# Patient Record
Sex: Female | Born: 1995 | Race: White | Hispanic: No | Marital: Single | Smoking: Never smoker
Health system: Southern US, Community
[De-identification: ages and names within clinical notes are randomized; demographics above are authoritative.]

## PROBLEM LIST (undated history)

## (undated) DIAGNOSIS — J45909 Unspecified asthma, uncomplicated: Secondary | ICD-10-CM

---

## 2015-02-18 DIAGNOSIS — J029 Acute pharyngitis, unspecified: Secondary | ICD-10-CM | POA: Diagnosis not present

## 2015-03-16 DIAGNOSIS — J309 Allergic rhinitis, unspecified: Secondary | ICD-10-CM | POA: Diagnosis not present

## 2015-03-16 DIAGNOSIS — R0600 Dyspnea, unspecified: Secondary | ICD-10-CM | POA: Diagnosis not present

## 2015-06-15 DIAGNOSIS — J209 Acute bronchitis, unspecified: Secondary | ICD-10-CM

## 2015-06-16 ENCOUNTER — Ambulatory Visit
Admission: RE | Admit: 2015-06-16 | Discharge: 2015-06-16 | Disposition: A | Discharge status: Home / Self Care | Payer: BLUE CROSS/BLUE SHIELD | Source: Ambulatory Visit | Attending: Family Medicine | Admitting: Family Medicine

## 2015-06-16 ENCOUNTER — Other Ambulatory Visit: Payer: Self-pay | Admitting: Family Medicine

## 2015-06-16 DIAGNOSIS — R61 Generalized hyperhidrosis: Secondary | ICD-10-CM

## 2015-06-16 DIAGNOSIS — R058 Other specified cough: Secondary | ICD-10-CM

## 2015-06-16 DIAGNOSIS — R05 Cough: Secondary | ICD-10-CM

## 2015-10-04 ENCOUNTER — Other Ambulatory Visit: Payer: Self-pay | Admitting: Family Medicine

## 2015-10-04 ENCOUNTER — Encounter: Payer: Self-pay | Admitting: Family Medicine

## 2015-10-04 ENCOUNTER — Ambulatory Visit
Admission: RE | Admit: 2015-10-04 | Discharge: 2015-10-04 | Disposition: A | Discharge status: Home / Self Care | Payer: BLUE CROSS/BLUE SHIELD | Source: Ambulatory Visit | Attending: Family Medicine | Admitting: Family Medicine

## 2015-10-04 ENCOUNTER — Ambulatory Visit (INDEPENDENT_AMBULATORY_CARE_PROVIDER_SITE_OTHER): Payer: BLUE CROSS/BLUE SHIELD | Admitting: Family Medicine

## 2015-10-04 DIAGNOSIS — M545 Low back pain, unspecified: Secondary | ICD-10-CM

## 2015-10-04 MED ORDER — CYCLOBENZAPRINE HCL 5 MG PO TABS: 5.0000 mg | ORAL_TABLET | Freq: Every day | ORAL | Status: DC

## 2015-10-04 MED ORDER — NAPROXEN 500 MG PO TABS: 500.0000 mg | ORAL_TABLET | Freq: Two times a day (BID) | ORAL | Status: DC

## 2015-10-04 NOTE — Progress Notes (Signed)
Patient ID: Leslie Gilmore, female   DOB: 1995/12/29, 20 y.o.   MRN: [id number]  Patient presents today with symptoms of lower back pain. Patient states that she's had the symptoms for the last 4-5 weeks. She denies any particular injury. She denies any chronic history of back pain. She denies any radicular symptoms, incontinence, foot drop, lower extremity weakness. She has noticed pain in a general area of her lower back with extension and flexion. She denies any problems when she is sleeping. She now states that she can feel the pain sometimes with walking. She admits that she was told that she had mild scoliosis before.  ROS: Negative except mentioned above.  Vitals as per Epic. GENERAL: NAD RESP: CTA B CARD: RRR  MSK: No midline tenderness to the back, paravertebral tenderness generalized on the right and left in the lower lumbar area, pain with flexion and extension however more pronounced with extension, negative straight leg raise, 5 out of 5 strength of lower extremities, normal heel and toe walk, normal gait, NV intact  A/P: Lower back pain-will do x-rays initially to evaluate for spondy., Naprosyn and Flexeril prescribed, rest, I imagine she will be limited for the last 2 practices of the season. If the x-rays are negative she can participate based on her pain level and management per training staff.

## 2015-10-14 ENCOUNTER — Encounter: Payer: Self-pay | Admitting: Family Medicine

## 2015-10-14 ENCOUNTER — Ambulatory Visit (INDEPENDENT_AMBULATORY_CARE_PROVIDER_SITE_OTHER): Payer: BLUE CROSS/BLUE SHIELD | Admitting: Family Medicine

## 2015-10-14 DIAGNOSIS — M545 Low back pain, unspecified: Secondary | ICD-10-CM

## 2015-10-14 MED ORDER — CYCLOBENZAPRINE HCL 5 MG PO TABS: 5.0000 mg | ORAL_TABLET | Freq: Two times a day (BID) | ORAL | Status: DC | PRN

## 2015-10-14 NOTE — Progress Notes (Signed)
Patient ID: Jenita SeashoreElizabeth Troutman, female   DOB: 04-07-1996, 20 y.o.   MRN: [id number]   Patient presents today for follow-up regarding her lower back pain. Patient states that she has not had much improvement with the medications that were prescribed for her and rest. She denies any incontinence, foot drop, radicular symptoms. Her pain is now more localized to the upper lumbar area. She still is having pain with extension but now feels more discomfort with flexion. Sitting and lying down are also causing discomfort. Has some pain with walking at times. Her x-rays were read as normal.  ROS: Negative except mentioned above.  Vitals as per Epic.  GENERAL: NAD RESP: CTA B CARD: RRR MSK: no midline tenderness, mild to moderate discomfort with spasm in the upper lumbar region R>L, decreased flexion due to discomfort, now has slightly more discomfort with flexion then extension, discomfort in back with testing straight leg raise but no radicular symptoms, tight hamstrings, normal gait.  NEURO: CN II-XII grossly intact   A/P: Lumbar back pain with no radicular symptoms- patient has not had significant improvement with the medications prescribed and rest, x-rays were read as normal, will proceed with doing an MRI to look for any discogenic or spondy., Will refill her Flexeril which she does state is helping some. She believes that she still has some Naproxen and will switch over to Aleve after this has run out if needed. I've asked her to seek immediate medical attention if symptoms do worsen as discussed. She will likely need need to follow-up with a back specialist, PT etc. at home as she is leaving for summer break next week.

## 2015-10-20 ENCOUNTER — Ambulatory Visit
Admission: RE | Admit: 2015-10-20 | Discharge: 2015-10-20 | Disposition: A | Discharge status: Home / Self Care | Payer: BLUE CROSS/BLUE SHIELD | Source: Ambulatory Visit | Attending: Family Medicine | Admitting: Family Medicine

## 2015-10-20 DIAGNOSIS — M4806 Spinal stenosis, lumbar region: Secondary | ICD-10-CM | POA: Insufficient documentation

## 2015-10-20 DIAGNOSIS — M5126 Other intervertebral disc displacement, lumbar region: Secondary | ICD-10-CM | POA: Diagnosis not present

## 2015-10-20 DIAGNOSIS — M545 Low back pain, unspecified: Secondary | ICD-10-CM

## 2015-10-21 ENCOUNTER — Encounter: Payer: Self-pay | Admitting: Family Medicine

## 2015-10-21 ENCOUNTER — Ambulatory Visit (INDEPENDENT_AMBULATORY_CARE_PROVIDER_SITE_OTHER): Payer: BLUE CROSS/BLUE SHIELD | Admitting: Family Medicine

## 2015-10-21 DIAGNOSIS — M545 Low back pain: Secondary | ICD-10-CM

## 2015-10-21 NOTE — Progress Notes (Signed)
Patient ID: Leslie Gilmore, female   DOB: July 16, 1995, 20 y.o.   MRN: [id number] Patient presents today to discuss results of her lumbar spine MRI. Patient states that her pain symptoms have improved since her last visit with me. She denies any radicular symptoms, incontinence, foot drop, weakness of lower extremities. She did call her father on her cell phone so he could hear the results of the MRI - a disc bulge at L4-5 contributing to borderline bilateral subarticular lateral recess stenosis. However, no overt impingement was identified. The patient is going home this weekend to [place]. She will be there for the rest of the summer and will be [place] [place] [place]. I have advised her to see an orthopedic back specialist when she is home. Her father states that she will likely see an orthopedic physician at [facility]. I have encouraged Leslie Gilmore to take her imaging and reports [facility] with her when she goes home. I have advised her to avoid any activities for now that cause her pain such as squatting or lifting. I will discuss the plan moving forward with Leslie Gilmore, the trainer for volleyball. Patient did not have any significant improvement with Naprosyn so will try oral tapered steroid pack for 12 days. If patient has any ill side effects from the Prednisone she is to stop the medication and let the trainer know so I can be informed. Will discuss further course of care after patient has seen orthopedic back specialist at home. Patient's and father's questions were answered. Appreciative of care.

## 2016-01-24 ENCOUNTER — Ambulatory Visit (INDEPENDENT_AMBULATORY_CARE_PROVIDER_SITE_OTHER): Payer: BLUE CROSS/BLUE SHIELD | Admitting: Family Medicine

## 2016-01-24 DIAGNOSIS — M545 Low back pain, unspecified: Secondary | ICD-10-CM

## 2016-01-24 MED ORDER — DICLOFENAC SODIUM 75 MG PO TBEC: 75.0000 mg | DELAYED_RELEASE_TABLET | Freq: Two times a day (BID) | ORAL | 0 refills | Status: AC

## 2016-01-24 MED ORDER — CYCLOBENZAPRINE HCL 5 MG PO TABS: 5.0000 mg | ORAL_TABLET | Freq: Every day | ORAL | 1 refills | Status: AC

## 2016-01-25 ENCOUNTER — Other Ambulatory Visit: Payer: Self-pay | Admitting: Family Medicine

## 2016-01-25 DIAGNOSIS — IMO0002 Reserved for concepts with insufficient information to code with codable children: Secondary | ICD-10-CM

## 2016-01-25 NOTE — Progress Notes (Signed)
Patient presents today for follow-up regarding her lower back pain. Patient met with me a week ago with her father to discuss her current situation with her back. Over the summer the patient was seen by a physician affiliated with the [facility]. Her imaging that was done here was reviewed with the physician and the physician suggested that she had a strain of her lumbar back and should work with PT. She had dry needling and cupping as some of the alternative treatments as well. 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 She states that she did not have any significant pain with playing volleyball over the summer.  I advised the patient to come and see me on a weekly basis as she starts to advance her activity with volleyball this season. She is here today to discuss how her first week has been with volleyball. She admits that initially she was put into normal volleyball activities like everyone else and started to notice that her back was starting to hurt. Initially she did not voice any discomfort to the training staff or coaches. She later did voice her discomfort to her trainer and the coaches were informed again on certain modifications and restrictions Jaelynne would have with training. In the last day or two she has improved and admits no pain in my office today. She has been keeping a log regarding her daily athletic activity. She denies taking any medications for her symptoms. She has been doing some cross training on a bike which did cause her some radicular symptoms on one occasion. She denies any problems with back pain in the weight room thus far this season. Patient denies any incontinence, foot drop, fever/chills, weight loss, lower extremity tingling or numbness or weakness.  ROS: Negative except mentioned above. Vitals as per Epic.  GENERAL: NAD RESP: CTA B CARD: RRR MSK: no midline tenderness, mild paravertebral tenderness in the general lumbar  area L>R, mild spasm appreciated in this area, FROM, discomfort with flexion and extension, -SLR, normal toe and heel walking, 5/5 strength of LEs, good hamstring flexibility NEURO: CN II-XII grossly intact   A/P: Lumbar Back Pain - patient has no radicular symptoms at this time, given her pain with extension however I am going to do further imaging with SPECT, after reviewing the results and will refer her to Dr. Halford Chessman for further evaluation and treatment regarding her back. I've encouraged the patient to communicate her symptoms to her trainer and coaches on a daily basis. I've advised the training staff to modify and/or restrict her from certain activities that cause her discomfort. She'll start off with doing 50% of practice and increasing after a few days based on discomfort level. She'll continue to do back rehabilitation exercises and cross training activities that do not cause her pain. She will also have a formal evaluation by PT and continue dry needling if this is helping her. Patient feels comfortable with this plan and agrees to communicate what she is feeling. I have prescribed her Diclofenac and Flexeril to use if needed. She will follow up with me next week.

## 2016-01-27 ENCOUNTER — Encounter
Admission: RE | Admit: 2016-01-27 | Discharge: 2016-01-27 | Disposition: A | Discharge status: Home / Self Care | Payer: BLUE CROSS/BLUE SHIELD | Source: Ambulatory Visit | Attending: Family Medicine | Admitting: Family Medicine

## 2016-01-27 DIAGNOSIS — Z77123 Contact with and (suspected) exposure to radon and other naturally occuring radiation: Secondary | ICD-10-CM | POA: Insufficient documentation

## 2016-01-27 DIAGNOSIS — M545 Low back pain, unspecified: Secondary | ICD-10-CM

## 2016-01-27 DIAGNOSIS — IMO0002 Reserved for concepts with insufficient information to code with codable children: Secondary | ICD-10-CM

## 2016-01-27 LAB — PREGNANCY, URINE: PREG TEST UR: NEGATIVE

## 2016-01-27 MED ORDER — TECHNETIUM TC 99M MEDRONATE IV KIT
21.9190 | PACK | Freq: Once | INTRAVENOUS | Status: AC | PRN
  Administered 2016-01-27: 21.919 via INTRAVENOUS

## 2016-02-01 ENCOUNTER — Encounter: Payer: Self-pay | Admitting: Family Medicine

## 2016-02-01 ENCOUNTER — Ambulatory Visit (INDEPENDENT_AMBULATORY_CARE_PROVIDER_SITE_OTHER): Payer: BLUE CROSS/BLUE SHIELD | Admitting: Family Medicine

## 2016-02-01 DIAGNOSIS — M545 Low back pain, unspecified: Secondary | ICD-10-CM

## 2016-02-01 NOTE — Progress Notes (Signed)
Patient presents today for weekly follow-up regarding her lower back pain. Patient states that she is in no pain today and feels good. She knows that the season is starting soon with volleyball and wishes to advance her practice so that she can play and games. She denies any radicular symptoms or any spasms of her back today. She is taking her diclofenac and Flexeril as prescribed. She met with Dr. Gerald Dexter last week who recommended continuing with physical therapy/rehabilitation with trainer. He stated that he would review Leslie Gilmore's history, exam, imaging with Dr. Halford Chessman to see if any further treatment is advised.  No exam performed today.  A/P: Lower back pain - patient states she is in no pain today and feels good, I have advised her that we should continue to advance her slowly with activity so that we do not have a setback with her experiencing back pain again. I advised her that she needs to continue her back rehabilitation with core strengthening, glute  strengthening and hamstring flexibility. She addresses understanding of this and states that she just is not a patient person and would like to start playing in games. She does however understand what I am saying about taking her advancement slowly. She will follow up with me next week and I will discuss with trainer the discussion I had with Leslie Gilmore today.

## 2016-02-08 ENCOUNTER — Encounter: Payer: Self-pay | Admitting: Family Medicine

## 2016-02-08 ENCOUNTER — Ambulatory Visit (INDEPENDENT_AMBULATORY_CARE_PROVIDER_SITE_OTHER): Payer: BLUE CROSS/BLUE SHIELD | Admitting: Family Medicine

## 2016-02-08 DIAGNOSIS — M545 Low back pain, unspecified: Secondary | ICD-10-CM

## 2016-02-08 NOTE — Progress Notes (Signed)
Patient presents today for follow-up regarding her back pain. Patient states that she has minimal pain at worst a 1 out of 10. She denies any radicular symptoms. She feels comfortable with everything she is doing an practice and in the weight room. Due to a teammate's injury she may have to play more. She feels comfortable with this. She has also continued to do back rehabilitation. She has been seeing the physical therapist once weekly.  No exam today.   A/P: Lower back pain- encourage patient to continue to voice if any discomfort increases, prescription medications as needed, continue to do back rehabilitation, patient will not need to follow-up with me once weekly unless she has any worsening symptoms. I will check in with the athletic trainer once a week to see how she is doing. Patient addresses understanding and is appreciative.

## 2016-02-28 ENCOUNTER — Ambulatory Visit (INDEPENDENT_AMBULATORY_CARE_PROVIDER_SITE_OTHER): Payer: BLUE CROSS/BLUE SHIELD | Admitting: Family Medicine

## 2016-02-28 VITALS — BP 132/78 | HR 77 | Temp 98.3°F | Resp 14

## 2016-02-28 DIAGNOSIS — J069 Acute upper respiratory infection, unspecified: Secondary | ICD-10-CM

## 2016-02-28 MED ORDER — AMOXICILLIN 500 MG PO CAPS: 500.0000 mg | ORAL_CAPSULE | Freq: Two times a day (BID) | ORAL | 0 refills | Status: DC

## 2016-02-28 NOTE — Progress Notes (Signed)
Patient presents today with nasal congestion, cough. She denies any sore throat, chest pain, severe headache. Patient states that she's had the symptoms for the last week. She denies any fever or chills or any wheezing or shortness of breath. She has been taking DayQuil for her symptoms. She denies any history of asthma. She does have tubes in both ears the one in the left ear is nonfunctional. She plans to see a [facility] in the near future regarding this. She denies any drainage from the ears. Patient states her lower back has been feeling good and she has no problems at this time.  ROS: Negative except mentioned above.  Vitals as per Epic.  GENERAL: NAD HEENT: mild pharyngeal erythema, no exudate, no erythema of TMs, no discharge from ears, left tube in ear is clogged and sideways, right tube patent, no cervical LAD RESP: CTA B CARD: RRR NEURO: CN II-XII grossly intact   A/P: URI- will treat patient with amoxicillin since Augmentin is bad on her stomach, Tylenol/ibuprofen when necessary, Flonase when necessary, Claritin when necessary, Sudafed when necessary, Delsym when necessary, rest, hydration, seek medical attention if symptoms persist or worsen no athletic activity if febrile.

## 2016-03-01 ENCOUNTER — Other Ambulatory Visit: Payer: Self-pay | Admitting: Family Medicine

## 2016-03-01 MED ORDER — BENZONATATE 100 MG PO CAPS: 100.0000 mg | ORAL_CAPSULE | Freq: Two times a day (BID) | ORAL | 0 refills | Status: DC | PRN

## 2016-03-14 ENCOUNTER — Ambulatory Visit (INDEPENDENT_AMBULATORY_CARE_PROVIDER_SITE_OTHER): Payer: BLUE CROSS/BLUE SHIELD | Admitting: Family Medicine

## 2016-03-14 VITALS — BP 121/68 | HR 84 | Temp 96.0°F | Resp 14

## 2016-03-14 DIAGNOSIS — J029 Acute pharyngitis, unspecified: Secondary | ICD-10-CM | POA: Diagnosis not present

## 2016-03-14 LAB — POCT INFLUENZA A/B

## 2016-03-14 MED ORDER — DOXYCYCLINE HYCLATE 100 MG PO TABS: 100.0000 mg | ORAL_TABLET | Freq: Two times a day (BID) | ORAL | 0 refills | Status: DC

## 2016-03-14 NOTE — Progress Notes (Signed)
She presents today with symptoms of nasal congestion, cough, wheezing at times with exercise. Patient denies any fever. She was sick a few weeks ago and got better and now sick again. She denies any chest pain, shortness of breath, extreme fatigue. She did have some myalgias yesterday that kept her up at night. She has not taken any medications today. She is afebrile in the office.  ROS: Negative except mentioned above. Vitals as per Epic  GENERAL: NAD HEENT: mild pharyngeal erythema, no exudate, no erythema of TMs, no drainage from tubes, left tube not functional, no cervical LAD RESP: CTA B CARD: RRR NEURO: CN II-XII grossly intact   A/P: URI - Influenza screen negative, will draw CBC and mono, encourage patient to use inhalers, Claritin, Sudafed, Flonase for her nasal congestion, ibuprofen when necessary, will review CBC and mono results and then discuss whether antibiotic is needed. No athletics for now if febrile.

## 2016-03-14 NOTE — Addendum Note (Signed)
Addended by: Dione HousekeeperPATEL, Cyprian Gongaware N on: 03/14/2016 01:12 PM   Modules accepted: Orders

## 2016-03-15 LAB — CBC WITH DIFFERENTIAL/PLATELET
BASOS ABS: 0.1 10*3/uL (ref 0.0–0.2)
Basos: 1 %
EOS (ABSOLUTE): 0.1 10*3/uL (ref 0.0–0.4)
Eos: 0 %
Hematocrit: 39 % (ref 34.0–46.6)
Hemoglobin: 13.8 g/dL (ref 11.1–15.9)
IMMATURE GRANS (ABS): 0 10*3/uL (ref 0.0–0.1)
Immature Granulocytes: 0 %
LYMPHS ABS: 1.8 10*3/uL (ref 0.7–3.1)
LYMPHS: 11 %
MCH: 31.4 pg (ref 26.6–33.0)
MCHC: 35.4 g/dL (ref 31.5–35.7)
MCV: 89 fL (ref 79–97)
Monocytes Absolute: 1.2 10*3/uL — ABNORMAL HIGH (ref 0.1–0.9)
Monocytes: 8 %
NEUTROS ABS: 12.5 10*3/uL — AB (ref 1.4–7.0)
Neutrophils: 80 %
PLATELETS: 301 10*3/uL (ref 150–379)
RBC: 4.39 x10E6/uL (ref 3.77–5.28)
RDW: 12.5 % (ref 12.3–15.4)
WBC: 15.7 10*3/uL — AB (ref 3.4–10.8)

## 2016-03-15 LAB — MONONUCLEOSIS SCREEN: Mono Screen: NEGATIVE

## 2016-03-27 ENCOUNTER — Ambulatory Visit
Admission: RE | Admit: 2016-03-27 | Discharge: 2016-03-27 | Disposition: A | Discharge status: Home / Self Care | Payer: BLUE CROSS/BLUE SHIELD | Source: Ambulatory Visit | Attending: Family Medicine | Admitting: Family Medicine

## 2016-03-27 ENCOUNTER — Other Ambulatory Visit: Payer: Self-pay | Admitting: Family Medicine

## 2016-03-27 DIAGNOSIS — R05 Cough: Secondary | ICD-10-CM | POA: Insufficient documentation

## 2016-03-27 DIAGNOSIS — R059 Cough, unspecified: Secondary | ICD-10-CM

## 2016-03-28 ENCOUNTER — Ambulatory Visit (INDEPENDENT_AMBULATORY_CARE_PROVIDER_SITE_OTHER): Payer: BLUE CROSS/BLUE SHIELD | Admitting: Family Medicine

## 2016-03-28 ENCOUNTER — Encounter: Payer: Self-pay | Admitting: Family Medicine

## 2016-03-28 VITALS — BP 107/60 | HR 60 | Temp 97.9°F | Resp 14

## 2016-03-28 DIAGNOSIS — J0191 Acute recurrent sinusitis, unspecified: Secondary | ICD-10-CM

## 2016-03-28 DIAGNOSIS — J069 Acute upper respiratory infection, unspecified: Secondary | ICD-10-CM

## 2016-03-28 NOTE — Progress Notes (Signed)
Patient presents today with persistent symptoms of nasal congestion and chest congestion. Patient has been on rounds of amoxicillin and doxycycline. She has been taking Sudafed on occasion and has been using a nasal steroid. She has some wheezing that happens with activity and has been using her albuterol inhaler. She denies any night sweats or fever or weight loss. Chest x-ray was done yesterday which showed questionable granulomas first pulmonary vessels. There was no infiltrate that was noted. Patient's vitals in clinic today are normal.  ROS: Negative except mentioned above.  Vitals as per Epic.  GENERAL: NAD HEENT: no pharyngeal erythema, no exudate, no erythema of TMs, positive tube in the right ear no tube in left ear, no cervical LAD RESP: CTA B CARD: RRR NEURO: CN II-XII grossly intact   A/P: Sinusitis/URI - will treat patient with Medrol Dosepak, Augmentin for 7 days, encourage patient to take probiotic and to stop antibiotic if any diarrhea, Flonase, Claritin-D, Albuterol when necessary, rest, hydration, seek medical attention if symptoms persist or worsen. Would recommend having patient see ENT if symptoms still do not resolve. I will also discuss her chest x-ray results with a pulmonologist to see if a repeat chest x-ray needs to be done in a few weeks or a chest CT should be done. This was discussed with the patient.

## 2016-04-04 ENCOUNTER — Other Ambulatory Visit: Payer: Self-pay | Admitting: Family Medicine

## 2016-04-04 DIAGNOSIS — R05 Cough: Secondary | ICD-10-CM

## 2016-04-04 DIAGNOSIS — R053 Chronic cough: Secondary | ICD-10-CM

## 2016-04-10 ENCOUNTER — Ambulatory Visit
Admission: RE | Admit: 2016-04-10 | Discharge: 2016-04-10 | Disposition: A | Discharge status: Home / Self Care | Payer: BLUE CROSS/BLUE SHIELD | Source: Ambulatory Visit | Attending: Family Medicine | Admitting: Family Medicine

## 2016-04-10 DIAGNOSIS — R05 Cough: Secondary | ICD-10-CM | POA: Diagnosis present

## 2016-04-10 DIAGNOSIS — R053 Chronic cough: Secondary | ICD-10-CM

## 2016-04-10 HISTORY — DX: Unspecified asthma, uncomplicated: J45.909

## 2016-04-10 MED ORDER — IOPAMIDOL (ISOVUE-300) INJECTION 61%
75.0000 mL | Freq: Once | INTRAVENOUS | Status: AC | PRN
  Administered 2016-04-10: 75 mL via INTRAVENOUS

## 2016-07-04 ENCOUNTER — Ambulatory Visit: Payer: BLUE CROSS/BLUE SHIELD | Admitting: Family Medicine

## 2016-07-11 ENCOUNTER — Encounter: Payer: Self-pay | Admitting: Family Medicine

## 2016-07-11 ENCOUNTER — Ambulatory Visit (INDEPENDENT_AMBULATORY_CARE_PROVIDER_SITE_OTHER): Payer: BLUE CROSS/BLUE SHIELD | Admitting: Family Medicine

## 2016-07-11 DIAGNOSIS — M5440 Lumbago with sciatica, unspecified side: Secondary | ICD-10-CM

## 2016-07-11 DIAGNOSIS — G8929 Other chronic pain: Secondary | ICD-10-CM

## 2016-07-13 ENCOUNTER — Ambulatory Visit: Payer: BLUE CROSS/BLUE SHIELD

## 2016-07-14 ENCOUNTER — Ambulatory Visit
Admission: RE | Admit: 2016-07-14 | Discharge: 2016-07-14 | Disposition: A | Discharge status: Home / Self Care | Payer: BLUE CROSS/BLUE SHIELD | Source: Ambulatory Visit | Attending: Family Medicine | Admitting: Family Medicine

## 2016-07-14 DIAGNOSIS — M5126 Other intervertebral disc displacement, lumbar region: Secondary | ICD-10-CM | POA: Insufficient documentation

## 2016-07-14 DIAGNOSIS — M48061 Spinal stenosis, lumbar region without neurogenic claudication: Secondary | ICD-10-CM | POA: Insufficient documentation

## 2016-07-14 DIAGNOSIS — M5440 Lumbago with sciatica, unspecified side: Secondary | ICD-10-CM

## 2016-07-14 DIAGNOSIS — G8929 Other chronic pain: Secondary | ICD-10-CM

## 2016-07-18 ENCOUNTER — Ambulatory Visit (INDEPENDENT_AMBULATORY_CARE_PROVIDER_SITE_OTHER): Payer: BLUE CROSS/BLUE SHIELD | Admitting: Family Medicine

## 2016-07-18 DIAGNOSIS — M5416 Radiculopathy, lumbar region: Secondary | ICD-10-CM

## 2016-07-18 DIAGNOSIS — M5417 Radiculopathy, lumbosacral region: Secondary | ICD-10-CM

## 2016-07-18 NOTE — Progress Notes (Signed)
Patient presents today for review of her MRI results. MRI results show some changes from previous MRI done last year. Patient states that her radicular symptoms in the right leg have resolved. She is on the sixth day of her 12 day course of oral prednisone. She is not having to use the muscle relaxant that was prescribed for her. She states that her back is sore but no real difference than what she has had in the past. She denies any lower extremity weakness, incontinence, fever, foot drop. She states that modifications are being met in the weight room at this time and that she is not being asked to do anything that is increasing her symptoms. She also admits that volleyball activity at this point is not causing her any difficulty. She states that she has a disc of her MRI and will be sending it to her physician back home.   Exam: deferred  A/P: Lumbar pain with radicular symptoms - MRI results were reviewed and there appears to be explanation for her radicular symptoms that she experienced, at this time the radicular symptoms have resolved, continue 6 more days of tapered dose of prednisone, use Flexeril if needed (she is not having to take this at this time), continue to modify activities in the weight room, avoid things such as deadlifts and waited squats, etc. discussed with patient that she needs to continue back rehabilitation (core strengthening, hamstring flexibility, glute strength, etc.) on a regular basis a few times a week. Will discuss above with trainer and Benjamine Mola in the room so there is no confusion. Trainer will then discuss this with strength and conditioning coach and coaching staff. Iliana is to make her trainer and/or coaches aware if she is being asked to do activity in the weight room that has not been recommended or causes her increased pain. Kessler voices understanding of this and agrees to do this. She states that there is no problems with activities that she is being asked to  do at this present time. I've discussed her MRI results and above recommendations with her father today on the phone with Jesus present. I have also recommended that she see a back specialist such as Dr. Halford Chessman at [facility] if any of her radicular symptoms return or if her back pain worsens in any way. Patient and father are agreeable to this.

## 2016-08-08 ENCOUNTER — Encounter: Payer: Self-pay | Admitting: Obstetrics and Gynecology

## 2016-08-31 NOTE — Progress Notes (Signed)
Patient presents today for request to be referred to OB/GYN to check placement of IUD. Patient states that she recently had it placed and was told by her OB/GYN that it needed to be checked by ultrasound. Patient denies any pelvic problems at this time. Patient reports having some radicular symptoms in her right leg recently. She states that her back discomfort is about the same as usual. She denies any recent injury or trauma. She denies any incontinence, fever, weight loss, foot drop. She is still following a modified program in the weight room. She states she feels comfortable with this program.  ROS: Negative except mentioned above.  GENERAL: NAD MSK: No midline tenderness of back, minimal paravertebral tenderness right greater than left in the lower lumbar area, full range of motion, negative straight leg raise, normal heel and toe walk, normal gait, NV intact NEURO: CN II-XII grossly intact   A/P: 1) IUD placement - patient given phone number to local OB/GYN to make appointment, will let me know if she has any problems with making this appointment  2) Lumbar back pain with radiculopathy - patient's last MRI was last year, will repeat MRI given new symptoms, discussed taking oral steroid pack, only activity that does not cause her increased symptoms for now, this was discussed with trainer. Patient is to report to me and or athletic trainer if coaches are making her do activity that she has been told not to do or that causes or increases her symptoms. Patient addresses understanding of this. Encouraged hamstring flexibility, core strengthening, other rehabilitation exercises. Can continue to see PT as well.

## 2016-09-22 ENCOUNTER — Ambulatory Visit (INDEPENDENT_AMBULATORY_CARE_PROVIDER_SITE_OTHER): Payer: BLUE CROSS/BLUE SHIELD | Admitting: Family Medicine

## 2016-09-22 VITALS — BP 111/71 | HR 85 | Temp 97.9°F

## 2016-09-22 DIAGNOSIS — J0100 Acute maxillary sinusitis, unspecified: Secondary | ICD-10-CM

## 2016-09-22 MED ORDER — AMOXICILLIN-POT CLAVULANATE 875-125 MG PO TABS: 1.0000 | ORAL_TABLET | Freq: Two times a day (BID) | ORAL | 0 refills | Status: DC

## 2016-09-22 NOTE — Progress Notes (Signed)
Patient presents today with symptoms of nasal congestion, sinus pressure, ear pressure, low-grade fever, mild nonproductive cough. She denies any chest pain, shortness of breath or severe headache. Patient has had symptoms for the last 4 days. She has had chronic issues with sinusitis and otitis media in the past.  ROS: Negative except mentioned above. Vitals as per Epic. GENERAL: NAD HEENT: no pharyngeal erythema, no exudate, no erythema of TMs, no drainage appreciated coming from the ears, mild maxillary sinus tenderness bilaterally, no cervical LAD RESP: CTA B CARD: RRR NEURO: CN II-XII grossly intact   A/P: Sinusitis, URI -will treat with Augmentin, oral antihistamine, nasal steroid, Sudafed when necessary, Ibuprofen when necessary, rest, hydration, seek medical attention if symptoms persist or worsen as discussed.

## 2017-02-15 ENCOUNTER — Ambulatory Visit (INDEPENDENT_AMBULATORY_CARE_PROVIDER_SITE_OTHER): Payer: BLUE CROSS/BLUE SHIELD | Admitting: Family Medicine

## 2017-02-15 VITALS — BP 134/83 | HR 80 | Temp 97.8°F | Resp 14 | Wt 175.0 lb

## 2017-02-15 DIAGNOSIS — J069 Acute upper respiratory infection, unspecified: Secondary | ICD-10-CM

## 2017-02-15 MED ORDER — AZITHROMYCIN 250 MG PO TABS: ORAL_TABLET | ORAL | 0 refills | Status: AC

## 2017-02-15 MED ORDER — BENZONATATE 100 MG PO CAPS: 100.0000 mg | ORAL_CAPSULE | Freq: Two times a day (BID) | ORAL | 0 refills | Status: AC | PRN

## 2017-02-15 NOTE — Progress Notes (Signed)
Patient presents today with symptoms of nasal congestion and productive cough. She has had the symptoms for the last week. She has been using her inhaled steroid and rescue inhaler as prescribed. Denies any fever or increased shortness of breath. She has been taking Claritin but has not been using the nasal steroid her ENT gave her. She states it leaves a bad taste. Denies any drainage from her ears. She had a good summer without any illnesses.  ROS: Negative except mentioned above. Vitals as per Epic.  GENERAL: NAD HEENT: no pharyngeal erythema, no exudate, no erythema of TMs or drainage, +scarring both ears, +tube in right ear, no cervical LAD RESP: CTA B CARD: RRR NEURO: CN II-XII grossly intact   A/P: URI - Tessalon Perles prn or Delysm, Z-pk, rest, hydration, seek medical attention if symptoms persist or worsen, continue allergy medication and inhalers as prescribed. Seek medical attention if symptoms persist or worsen.

## 2017-02-28 ENCOUNTER — Ambulatory Visit: Payer: BLUE CROSS/BLUE SHIELD | Admitting: Physical Therapy

## 2017-03-01 ENCOUNTER — Ambulatory Visit: Payer: BLUE CROSS/BLUE SHIELD | Attending: Family Medicine | Admitting: Physical Therapy

## 2017-03-01 DIAGNOSIS — G8929 Other chronic pain: Secondary | ICD-10-CM

## 2017-03-01 DIAGNOSIS — M545 Low back pain: Secondary | ICD-10-CM | POA: Insufficient documentation

## 2017-03-01 NOTE — Patient Instructions (Signed)
Squat assessment- pain roughly halfway up on R side of L spine   Lumbar assessment -- no pain   Mild pain with R leg knee extension and flexion  R hip flexion reproduces her pain in MMT   Slump test - no pain resolved with cervical extension,   L SLR at very end range, radiates through low back R SLR very point tender around 30 degrees on R side of L spine   IR - very painful ER not painful, FABER - not painful, Hip flexion - not painful,

## 2017-03-01 NOTE — Therapy (Signed)
[facility] [address] [place], [address] Phone: [phone]   Fax:  [phone]  Physical Therapy Evaluation  Patient Details  Name: Leslie Gilmore MRN: [id number] Date of Birth: January 29, 1996 Referring Provider: Dr. Jolene Provost  Encounter Date: 03/01/2017      PT End of Session - 03/01/17 1056    Visit Number 1   Number of Visits 7   Date for PT Re-Evaluation 04/26/17   PT Start Time 0801   PT Stop Time 0900   PT Time Calculation (min) 59 min   Activity Tolerance Patient tolerated treatment well   Behavior During Therapy [facility] for tasks assessed/performed      Past Medical History:  Diagnosis Date  . Asthma     No past surgical history on file.  There were no vitals filed for this visit.       Subjective Assessment - 03/01/17 0807    Subjective Patient reports she has had acute on chronic flare up of low back pain, yesterday was the first time it went down her R leg. She has had trouble sleeping, as she states her back feels like its on fire. She denies any numbness, tingling, or unexpected weight loss. She denies any issues with bowel/bladder. When she comes down on blocks/hits she does land on her RLE only.    Limitations Lifting  Sleeping   Diagnostic tests MRI - in February 2018, disc bulge to R at L4-L5   Patient Stated Goals To continue playing and improve her sleep quality.    Currently in Pain? Yes   Pain Score 2    Pain Location Back   Pain Orientation Lower   Pain Descriptors / Indicators Aching   Pain Type Chronic pain   Pain Onset More than a month ago   Pain Frequency Intermittent   Aggravating Factors  Playing volleyball            [facility] PT Assessment - 03/01/17 1102      Assessment   Medical Diagnosis Low back pain   Referring Provider Dr. Jolene Provost   Onset Date/Surgical Date --  Has been ongoing for years   Hand Dominance Right     Precautions    Precautions None     Restrictions   Weight Bearing Restrictions No     Balance Screen   Has the patient fallen in the past 6 months No   Has the patient had a decrease in activity level because of a fear of falling?  No   Is the patient reluctant to leave their home because of a fear of falling?  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     Cognition   Overall Cognitive Status Within Functional Limits for tasks assessed     Observation/Other Assessments-Edema    Edema --  None noted     Sensation   Light Touch Appears Intact      Squat assessment- pain roughly halfway up on R side of L spine   Lumbar assessment -- no pain   Mild pain with R leg knee extension and flexion  R hip flexion reproduces her pain in MMT   Slump test - no pain resolved with cervical extension,  L SLR at very end range, radiates through low back R SLR very point tender around 30 degrees on R side of L spine   IR - very painful ER not painful, FABER - not painful, Hip flexion - not painful,    CPAs- noted to have discomfort in lumbar spine, which was treated with grade I-II mobilizations, most tender at distal lumbar spine. Performed 3-5 bouts at each segment of 30-45" oscillations, patient reported decreased tenderness  Soft tissue assessment- spasming and tenderness noted throughout lumbar paraspinals, performed gentle effleurage which patient reported was tender at first, and eased with gradual exposure. Notable reduction in spasming and tenderness throughout.   Ely's test- positive for decreased flexibility bilaterally, R>L  Prone hip flexor length testing - more painful and uncomfortable for her on the R compared to the L   Provided and observed patient complete both supine  hip flexor stretch x 30" for 3 bouts as well as 1/2 kneeling hip flexor stretch x 30" for 3 bouts. Patient reported notable decrease in pain after completion of these.      Objective measurements completed on examination: See above findings.                  PT Education - 03/01/17 1101    Education provided Yes   Education Details Stretch her hip flexor on R side and monitor for symptoms afterwards, prone press ups with R sided bias (can add overpressure as well)   Person(s) Educated Patient   Methods Explanation;Demonstration;Handout   Comprehension Verbalized understanding;Returned demonstration             PT Long Term Goals - 03/01/17 1058      PT LONG TERM GOAL #1   Title Patient will report no pain while sleeping to complete ADLs.   Baseline Patient reports she gets pain that keeps her up at night every night.    Time 8   Period Weeks   Status New   Target Date 04/26/17     PT LONG TERM GOAL #2   Title Patient will report no increased pain with squatting to demonstrate improved tolerance for athletic activities.    Baseline Increase in pain initially today with squatting.    Time 8   Period Weeks   Status New   Target Date 04/26/17     PT LONG TERM GOAL #3   Title Patient will report worst pain of 2/10 to demonstrate improved tolerance for athletic activities and ADLs.    Baseline 2/10 at baseline, did not rate highest pain level.    Time 8   Period Weeks   Status New   Target Date 04/26/17                Plan - 03/01/17 1103    Clinical Impression Statement Patient is a very pleasant 21 y/o female that presents with acute on chronic low back pain, with very sporadic radicular symptoms down to R thigh, but not below the knee. This is consistent with previous imaging studies noting L4/L5 R sided disc bulge. She responds quite well in this session to manual joint mobilizations, especially with R sided bias to press ups. She also notes pain  with R hip flexor testing, which improved her pain with squatting once treated. She would benefit from skilled PT services to address long standing low back pain to allow her to continue to compete at a high level athletically and reduce risk of continued chronic low back pain.  Clinical Presentation Evolving   Clinical Decision Making High   Rehab Potential Good   Clinical Impairments Affecting Rehab Potential Patient is active, high level athlete but has had long standing low back pain.    PT Frequency 1x / week   PT Duration 6 weeks   PT Treatment/Interventions Electrical Stimulation;Dry needling;Manual techniques;Neuromuscular re-education;Therapeutic activities;Therapeutic exercise;Balance training   PT Next Visit Plan Progress manual treatments, consider TDN, assess rotational movements    PT Home Exercise Plan Prone press ups with R sided bias, R hip flexor stretching    Consulted and Agree with Plan of Care Patient      Patient will benefit from skilled therapeutic intervention in order to improve the following deficits and impairments:  Abnormal gait, Pain, Improper body mechanics, Decreased activity tolerance, Decreased strength, Decreased balance  Visit Diagnosis: Chronic bilateral low back pain without sciatica     Problem List There are no active problems to display for this patient.  Alva Garnet PT, DPT, CSCS    03/01/2017, 11:13 AM  [facility]OR[facility]ICINE [address], [place], [address] Phone: [phone]   Fax:  [phone]  Name: Leslie Gilmore MRN: [id number] Date of Birth: [birth year]

## 2017-04-24 ENCOUNTER — Encounter: Payer: Self-pay | Admitting: Family Medicine

## 2017-04-24 ENCOUNTER — Ambulatory Visit (INDEPENDENT_AMBULATORY_CARE_PROVIDER_SITE_OTHER): Payer: BLUE CROSS/BLUE SHIELD | Admitting: Family Medicine

## 2017-04-24 VITALS — BP 134/80 | HR 91 | Temp 98.1°F | Resp 14

## 2017-04-24 DIAGNOSIS — R0981 Nasal congestion: Secondary | ICD-10-CM

## 2017-04-24 DIAGNOSIS — R42 Dizziness and giddiness: Secondary | ICD-10-CM

## 2017-04-24 MED ORDER — AMOXICILLIN-POT CLAVULANATE 875-125 MG PO TABS: 1.0000 | ORAL_TABLET | Freq: Two times a day (BID) | ORAL | 0 refills | Status: AC

## 2017-04-24 MED ORDER — MECLIZINE HCL 25 MG PO TABS: 25.0000 mg | ORAL_TABLET | Freq: Three times a day (TID) | ORAL | 0 refills | Status: AC | PRN

## 2017-04-24 NOTE — Progress Notes (Signed)
Patient presents today with symptoms of dizziness for 1 day. Patient has had nasal congestion and minimal sore throat as well. She denies any significant headache. Patient has a long history of ear issues and tubes. She denies any drainage from the ears. She denies any significant pressure in her sinuses. She has had minimal cough. She denies any fever or chills. Most of her symptoms are with turning her head or standing up. Patient has an IUD and her last menstrual period was 04/08/17. She denies any chest pain, palpitations, shortness of breath, fever, UTI symptoms, abdominal pain, nausea/vomiting, diarrhea. She typically takes a Claritin-D dissolvable pill every evening for her chronic sinus/ear issues. She admits that she had breakfast this morning and some water. She has not had lunch yet. She denies any head injury or syncopal episode. She denies drinking significant caffeine throughout the day. She denies any other new medications.  ROS: Negative except mentioned above. Vitals: Standing 130/74 HR 90, Sitting 127/89 HR 85, Lying Down 136/80 HR 75 (some dizziness with standing) GENERAL: NAD HEENT: Minimal pharyngeal erythema, no exudate, no erythema of TMs, no discharge from the ears, can see tube in the right ear, scarring noted in both ears from previous infections, no cervical LAD, mild erythema of nasal mucosa, patient admits to dizziness when she turns her head RESP: CTA B CARD: RRR NEURO: CN II-XII grossly intact, PERRL, EOMI  A/P: Dizziness - likely related to nasal congestion, patient has chronic sinus/ear issues, advised patient to make sure that what she has at home pseudoephedrine, will prescribe her Meclizine, patient can also take Flonase OTC, Ibuprofen when necessary, if any worsening symptoms seek medical attention, encourage patient to stay hydrated, encouraged her not to drive until symptoms have resolved, no athletic activity until symptoms have resolved. If she starts to have  colored mucus, sinus pressure I have prescribed her Augmentin that she can pick up before she leaves to go home for Thanksgiving Break.

## 2017-08-30 ENCOUNTER — Ambulatory Visit (INDEPENDENT_AMBULATORY_CARE_PROVIDER_SITE_OTHER): Payer: BLUE CROSS/BLUE SHIELD | Admitting: Family Medicine

## 2017-08-30 VITALS — BP 123/67 | HR 95 | Temp 99.6°F | Resp 14

## 2017-08-30 DIAGNOSIS — J069 Acute upper respiratory infection, unspecified: Secondary | ICD-10-CM

## 2017-08-30 MED ORDER — AMOXICILLIN 500 MG PO CAPS: 500.0000 mg | ORAL_CAPSULE | Freq: Two times a day (BID) | ORAL | 0 refills | Status: AC

## 2017-08-30 NOTE — Progress Notes (Signed)
Patient presents today with symptoms sore throat, nasal congestion, cough for the last 3-4 days. She denies any fever, chills, headache, nausea, vomiting, diarrhea. She is going on a cruise for spring break in a few days. She states her last menstrual period was one week ago. She has been taking an oral antihistamine. Patient has had chronic ear infections and sinusitis issues in the past.   ROS: Negative except mentioned above.  GENERAL: NAD HEENT: mild pharyngeal erythema, no exudate, no erythema of TMs, +tube in right ear, no cervical LAD RESP: CTA B CARD: RRR NEURO: CN II-XII grossly intact   A/P: URI - encourage patient to take oral antihistamine, decongestant, nasal steroid, Tylenol/Ibuprofen with her on her trip for symptom relief. She should probably take a cough suppressant as well. Patient will take Amoxicillin with her in case her symptoms worsen and or she has colored mucus, sinus pressure, etc. Patient addresses understanding of this. Seek medical attention if symptoms persist or worsen as discussed.

## 2017-09-23 IMAGING — CT NM BONE W/ SPECT
1 series · 12 of 14 positions shown, 15 images · non-contrast
Comparison: None

CLINICAL DATA: BILATERAL low back pain without sciatica, chronic
low back pain with flexion and extension question stress injury

EXAM:
NM BONE SCAN AND SPECT/CT IMAGING
TECHNIQUE: After intravenous injection of radiopharmaceutical, delayed planar
images were obtained in multiple projections. Additionally, delayed
triplanar SPECT images were obtained through the area of interest.
Fused data sets were reconstructed.
RADIOPHARMACEUTICALS:  21.919 mCi 8c-DDm MDP IV

[Series 4: 3d bone 1.25 b70s · axial · 0.98mm/px · z∈[+1194,+1525]mm · 12 of 562 slices shown, 15 images]
[im 44/562  soft-tissue]
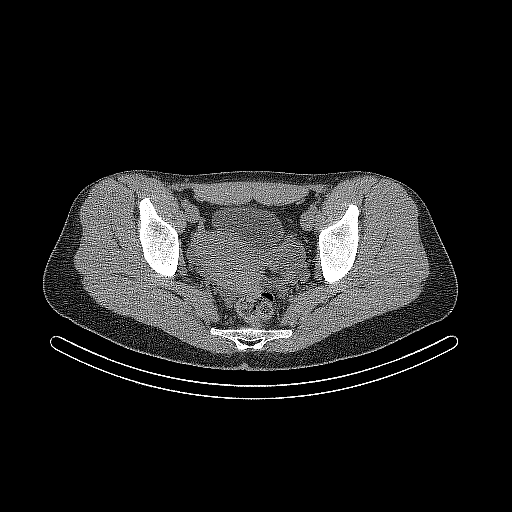
[im 44/562  bone]
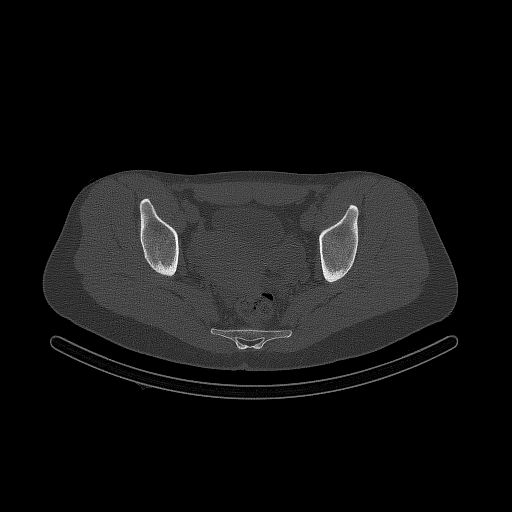
[im 87/562  bone]
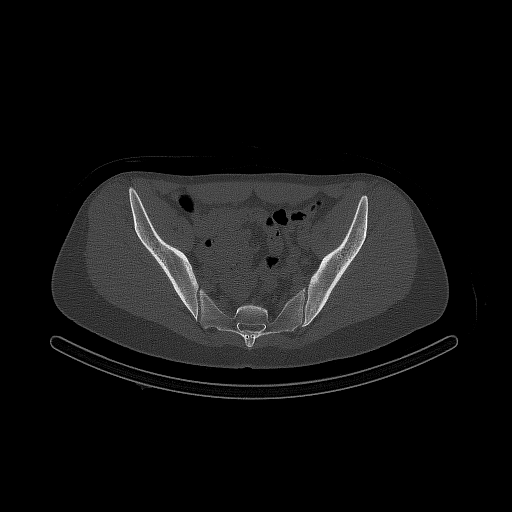
[im 130/562  bone]
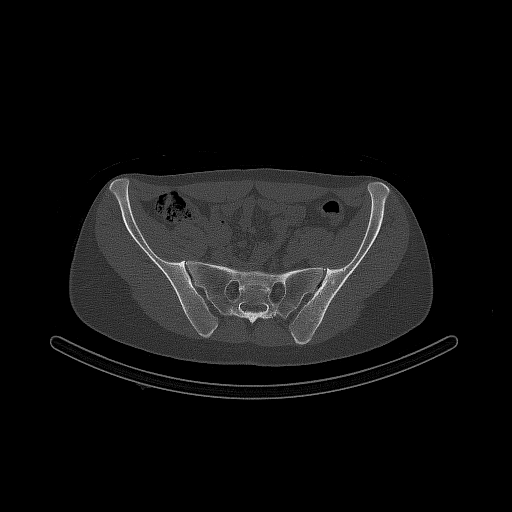
[im 173/562  bone]
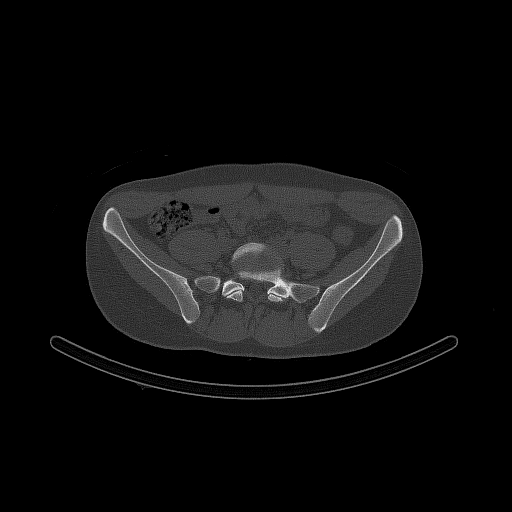
[im 216/562  soft-tissue]
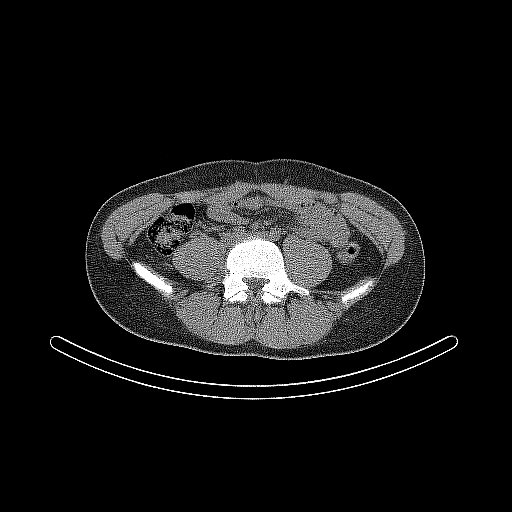
[im 216/562  bone]
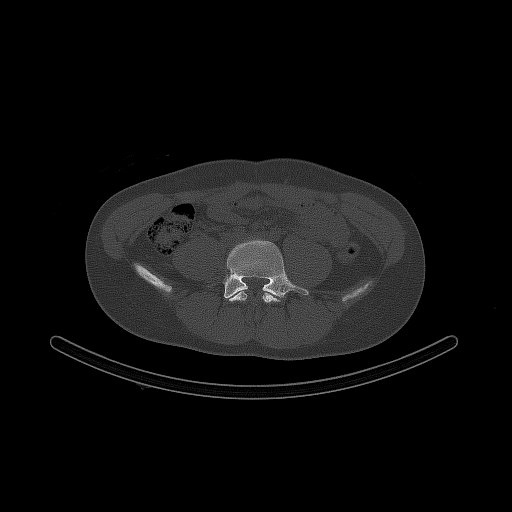
[im 259/562  bone]
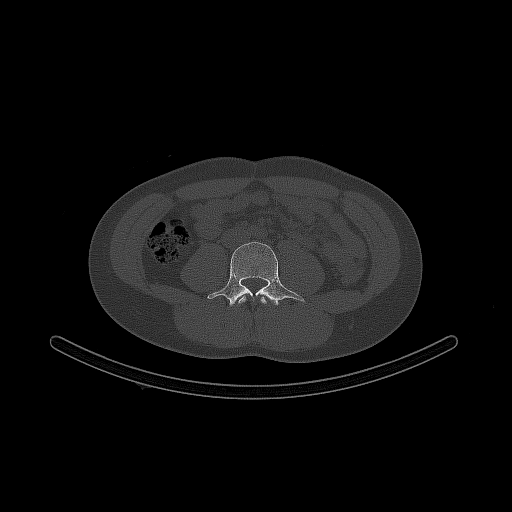
[im 303/562  bone]
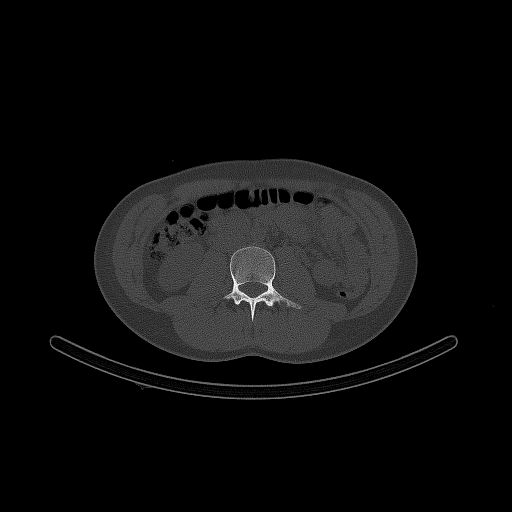
[im 346/562  bone]
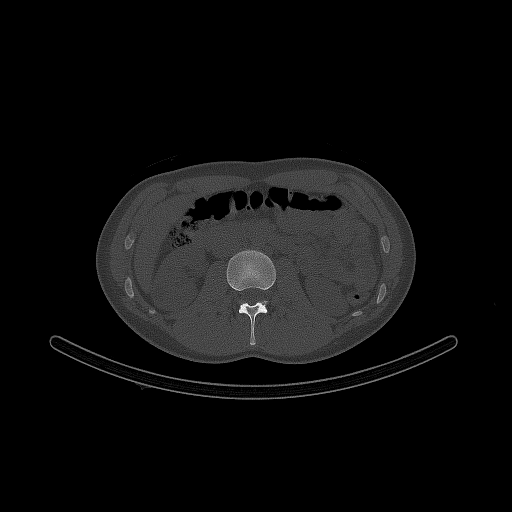
[im 389/562  soft-tissue]
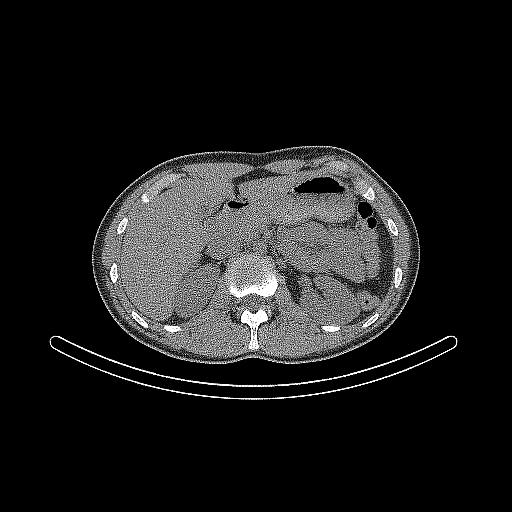
[im 389/562  bone]
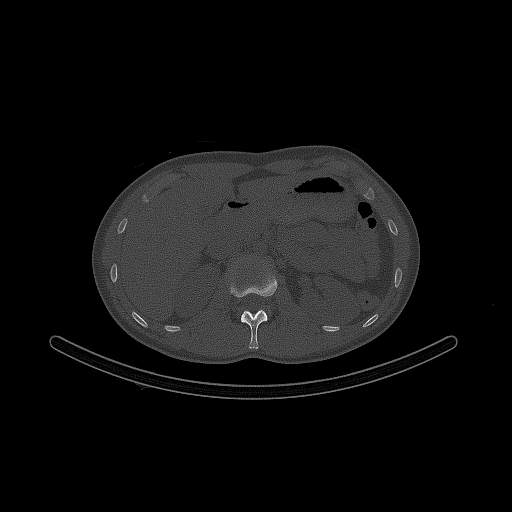
[im 432/562  bone]
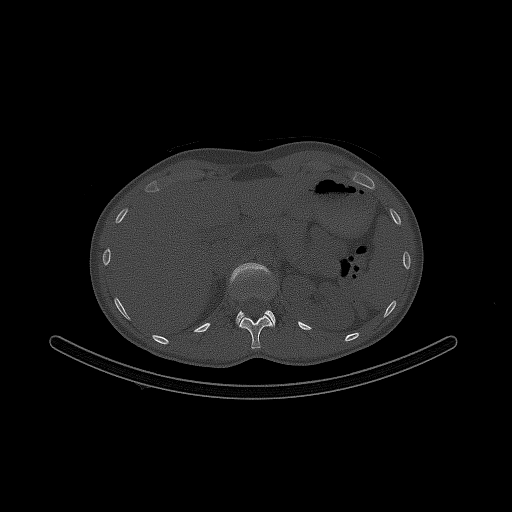
[im 475/562  bone]
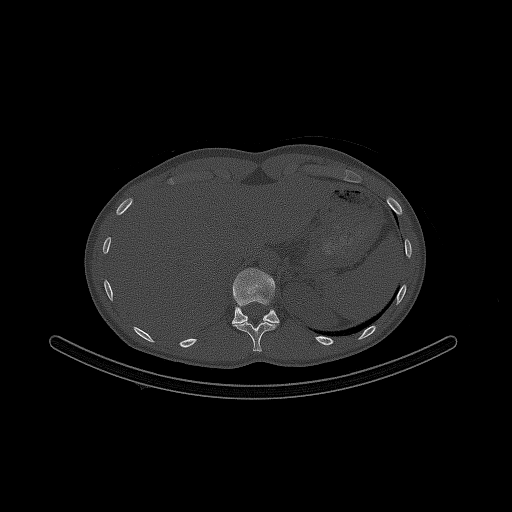
[im 518/562  bone]
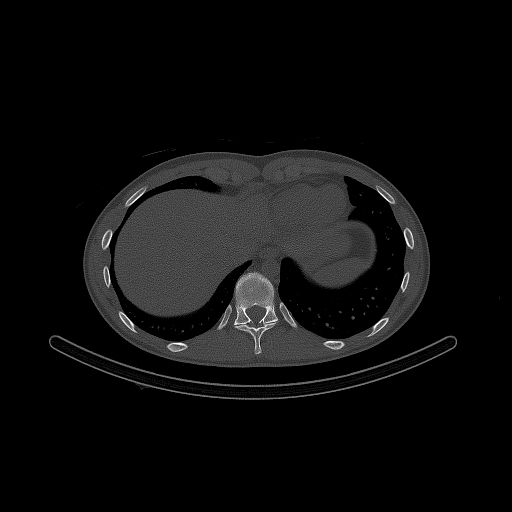

[12 of 14 positions shown; findings below may reference images not displayed]

FINDINGS: Planar scintigraphic images of the lumbar spine and upper pelvis are
normal.

SPECT imaging of the lumbar spine is normal.

No focal areas of increased or decreased osseous tracer accumulation
identified in the lumbar spine or upper pelvis.

CT imaging of the lumbar spine is normal.

No fracture, bone destruction, or sclerosis identified.

No evidence of spondylolysis.

SI joints and visualized portions of hip joints symmetric.

Tiny amount of cul-de-sac free fluid, potentially physiologic.

No definite soft tissue abnormalities identified on CT images
reconstructed on bone algorithm.
IMPRESSION: Normal SPECT/CT of the lumbar spine.

## 2017-10-12 ENCOUNTER — Ambulatory Visit (INDEPENDENT_AMBULATORY_CARE_PROVIDER_SITE_OTHER): Payer: BLUE CROSS/BLUE SHIELD | Admitting: Family Medicine

## 2017-10-12 ENCOUNTER — Encounter: Payer: Self-pay | Admitting: Family Medicine

## 2017-10-12 VITALS — BP 122/68 | HR 79 | Temp 98.5°F | Resp 14

## 2017-10-12 DIAGNOSIS — N390 Urinary tract infection, site not specified: Secondary | ICD-10-CM

## 2017-10-12 LAB — POCT URINE PREGNANCY: Preg Test, Ur: NEGATIVE

## 2017-10-12 MED ORDER — NITROFURANTOIN MONOHYD MACRO 100 MG PO CAPS: 100.0000 mg | ORAL_CAPSULE | Freq: Two times a day (BID) | ORAL | 0 refills | Status: AC

## 2017-10-12 NOTE — Progress Notes (Signed)
Patient presents today with symptoms of urinary frequency and dysuria. Patient states that she's had symptoms for the last 1-2 days. Patient has a IUD. She denies any abdominal pain, flank pain, fever, nausea, vomiting, diarrhea, headache, vaginal discharge, hematuria. She believes she has had a UTI once before. Has not taken any medications for her symptoms.  ROS: Negative except mentioned above. Vitals as per Epic GENERAL: NAD HEENT: no pharyngeal erythema, no exudate, no erythema of TMs, positive tube in the right ear, no cervical LAD RESP: CTA B CARD: RRR ABD: +BS, NT, no rebound or guarding, no flank tenderness NEURO: CN II-XII grossly intact   Urine dip - 2+ leukocytes, negative nitrites, 1+ protein, pH 5.0, 2+ blood, specific gravity 1.025, negative ketones, negative glucose  Urine pregnancy - negative  A/P: UTI - will send urine for culture, will treat patient with Macrobid, rest, hydration, seek medical attention if symptoms persist or worsen as discussed. No athletic activity or classic febrile.

## 2017-10-14 LAB — URINE CULTURE

## 2017-12-03 ENCOUNTER — Other Ambulatory Visit: Payer: Self-pay | Admitting: Family Medicine

## 2017-12-03 MED ORDER — FLUCONAZOLE 150 MG PO TABS: 150.0000 mg | ORAL_TABLET | Freq: Once | ORAL | 0 refills | Status: AC

## 2017-12-03 MED ORDER — FLUCONAZOLE 150 MG PO TABS: 150.0000 mg | ORAL_TABLET | Freq: Once | ORAL | 0 refills | Status: DC

## 2017-12-20 ENCOUNTER — Other Ambulatory Visit: Payer: Self-pay | Admitting: Family Medicine

## 2017-12-20 MED ORDER — MUPIROCIN CALCIUM 2 % EX CREA: 1.0000 "application " | TOPICAL_CREAM | Freq: Two times a day (BID) | CUTANEOUS | 0 refills | Status: AC

## 2018-02-12 ENCOUNTER — Encounter: Payer: Self-pay | Admitting: Family Medicine

## 2018-02-12 ENCOUNTER — Ambulatory Visit (INDEPENDENT_AMBULATORY_CARE_PROVIDER_SITE_OTHER): Payer: BLUE CROSS/BLUE SHIELD | Admitting: Family Medicine

## 2018-02-12 VITALS — BP 125/75 | HR 87 | Temp 98.1°F | Resp 14

## 2018-02-12 DIAGNOSIS — B001 Herpesviral vesicular dermatitis: Secondary | ICD-10-CM

## 2018-02-12 MED ORDER — VALACYCLOVIR HCL 1 G PO TABS: ORAL_TABLET | ORAL | 0 refills | Status: AC

## 2018-02-13 NOTE — Progress Notes (Signed)
Patient presents with symptoms of cold sores around lips. She has had them for years. Patient states that recently she has gotten 2 bouts within a month. She denies any discharge from the lesions, crusting of the lesions, fever, URI symptoms. She denies any new products on her face or lips. She denies any genital lesions. She denies any other symptoms at this time. The volleyball season and school has started so she does feel slightly stressed. She admits to feeling the tingling on her lip before the lesion appears. The one she has right now has been there for 2 days.   ROS: Negative except mentioned above. Vitals as per Epic. GENERAL: NAD HEENT: no pharyngeal erythema, no exudate, no erythema of TMs, no cervical LAD, one small raised erythematous lesion on lip, no discharge or crusting of the area RESP: CTA B CARD: RRR NEURO: CN II-XII grossly intact   A/P: Lip Lesion - discussed with patient that the Valtrex is to be taken at the start of symptoms, if symptoms persist or worsen despite use of medication should follow-up with me or student health. Patient addresses understanding.

## 2018-02-18 ENCOUNTER — Ambulatory Visit (INDEPENDENT_AMBULATORY_CARE_PROVIDER_SITE_OTHER): Payer: BLUE CROSS/BLUE SHIELD | Admitting: Family Medicine

## 2018-02-18 DIAGNOSIS — R21 Rash and other nonspecific skin eruption: Secondary | ICD-10-CM

## 2018-02-18 MED ORDER — DOXYCYCLINE HYCLATE 100 MG PO TABS: 100.0000 mg | ORAL_TABLET | Freq: Two times a day (BID) | ORAL | 0 refills | Status: DC

## 2018-02-18 NOTE — Progress Notes (Signed)
Patient presents today with rash around the mouth. Patient was seen here recently for what seemed to be a cold sore. Patient states now that she has other red areas that have appeared around her mouth. She denies that the areas feel like zits or cold sores. She denies any sore throat, fever, headache. She denies any recent insect bites. She denies any pain or itchiness of the rash. She denies anything draining from the rash. She denies a rash anywhere else besides around the mouth. She denies any new products on the face. She states that she does use makeup and does clean her brushes frequently. She has concerns that the rash may be due to a preservative issue. Patient states that her mother had a similar problem with a rash only around the mouth. Patient requests a referral to Dermatology.  ROS: Negative except mentioned above. Vitals:  GENERAL: NAD HEENT: no pharyngeal erythema, no exudate, no erythema of TMs, no cervical LAD RESP: CTA B CARD: RRR SKIN: four small erythematous slightly raised areas above the upper lip, two under the lower lip, no discharge from the area or crusting noted NEURO: CN II-XII grossly intact  A/P: Skin Rash - will try Doxycycline, encourage patient to wash her face with antibacterial soap that his unscented, wash any towels/washclothes or pillowcases that have come into contact with the face, will make referral to Dermatology but unsure whether the rash is actually related to a preservative issue. Seek medical attention if any acute worsening symptoms.

## 2018-02-21 ENCOUNTER — Ambulatory Visit: Payer: BLUE CROSS/BLUE SHIELD | Admitting: Family Medicine

## 2018-03-05 ENCOUNTER — Other Ambulatory Visit: Payer: Self-pay | Admitting: Family Medicine

## 2018-03-05 MED ORDER — DOXYCYCLINE HYCLATE 100 MG PO TABS: 100.0000 mg | ORAL_TABLET | Freq: Every day | ORAL | 0 refills | Status: AC

## 2018-03-25 ENCOUNTER — Ambulatory Visit (INDEPENDENT_AMBULATORY_CARE_PROVIDER_SITE_OTHER): Payer: BLUE CROSS/BLUE SHIELD | Admitting: Family Medicine

## 2018-03-25 VITALS — BP 127/71 | HR 70 | Temp 97.8°F | Resp 14

## 2018-03-25 DIAGNOSIS — R300 Dysuria: Secondary | ICD-10-CM

## 2018-03-25 LAB — POCT URINE PREGNANCY: PREG TEST UR: NEGATIVE

## 2018-03-25 NOTE — Progress Notes (Signed)
Patient presents today with symptoms of dysuria and vaginal itching a few days ago.  Patient states that she had a fluconazole 150 mg that she took 2 days ago.  She denies any symptoms now.  Patient has an IUD.  She denies any pelvic pain, flank pain, headache, fever, vaginal discharge, genital rash.  She denies any history of STDs. She is sexually active.   Urine Dip -negative leukocytes, negative nitrites, negative protein, negative blood, negative ketones, negative glucose, specific gravity 1.020, pH 5.0  Urine Pregnancy - negative  ROS: Negative except mentioned above. Vitals as per Epic. GENERAL: NAD HEENT: no pharyngeal erythema, no exudate RESP: CTA B CARD: RRR ABD: normal bowel sounds, nontender, no rebound or guarding appreciated, no flank tenderness NEURO: CN II-XII grossly intact   A/P: Dysuria, Vaginal Itching -possibly related to yeast infection since symptoms have resolved today, will still send urine for culture, if symptoms do return would recommend that patient have a pelvic exam as discussed.  Safe sex practices encouraged.

## 2018-03-27 LAB — URINE CULTURE

## 2018-05-17 ENCOUNTER — Ambulatory Visit (INDEPENDENT_AMBULATORY_CARE_PROVIDER_SITE_OTHER): Payer: BLUE CROSS/BLUE SHIELD | Admitting: Family Medicine

## 2018-05-17 ENCOUNTER — Encounter: Payer: Self-pay | Admitting: Family Medicine

## 2018-05-17 VITALS — Resp 14

## 2018-05-17 DIAGNOSIS — M545 Low back pain, unspecified: Secondary | ICD-10-CM

## 2018-05-17 DIAGNOSIS — G8929 Other chronic pain: Secondary | ICD-10-CM

## 2018-05-17 DIAGNOSIS — F411 Generalized anxiety disorder: Secondary | ICD-10-CM

## 2018-05-23 NOTE — Progress Notes (Signed)
Patient presents today for follow-up regarding her previous PHQ 9.  She is also here for her exit physical.  She denies any acute problems at this time. A new PHQ 9 was filled out while the patient was here.  Patient has a history of anxiety and also ADHD.  She states that her symptoms are controlled with Prozac and Adderall.  She is concerned however because her pediatrician back home will not refill her medications given her age.  She has not established with a primary care physician as she is graduating in May.  She believes that she has enough medication until January or February.  She has not established with a psychiatrist at home or here.  She denies any suicidal or homicidal ideations.  She denies being depressed. Patient is also here to discuss her chronic back issues.  She denies any acute back pain at this time.  She states that her back does feel better some now that volleyball is over.  She does not plan on playing professional volleyball.  She denies any radicular symptoms, lower extremity weakness, incontinence.  She does not take medication on a daily basis for her pain.  She has not been keeping up with her regular back rehab exercises.  She did not come see me this volleyball season for her back.  Her last MRI was 07/14/2016 which showed L4-5 broad-based disc bulge with a small right paracentral disc protrusion contacting the right intraspinal L5 nerve root. 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  ROS: Negative except mentioned above. No Exam.  A/P: 1) History of Anxiety and ADHD -PHQ 9 reviewed.  No acute issues.  Would recommend that patient be followed by psychiatrist as she is on Prozac and Adderall.  We will try to make appointment for patient when she returns in January.  Patient will confirm that she has enough medication until then.  If needed I will refill until she is seen by the psychiatrist.  Patient addresses understanding and is appreciative.  2)  Chronic Back Pain -patient does not have any back pain at this time today, would recommend that patient continue back rehab with athletic trainer or PT during the spring and in the future on her own, if patient continues to have problems would recommend that she see a back specialist.  She declines doing this at this time.  Seek medical attention as needed.

## 2018-08-13 ENCOUNTER — Ambulatory Visit (INDEPENDENT_AMBULATORY_CARE_PROVIDER_SITE_OTHER): Payer: BLUE CROSS/BLUE SHIELD | Admitting: Family Medicine

## 2018-08-13 VITALS — BP 125/80 | HR 97 | Temp 97.8°F | Resp 14

## 2018-08-13 DIAGNOSIS — H9211 Otorrhea, right ear: Secondary | ICD-10-CM

## 2018-08-13 NOTE — Progress Notes (Signed)
Patient presents today with symptoms of right ear drainage.  Patient denies any pain from the ear at this time.  She admits that she has been on 2 courses of antibiotics.  These were not prescribed by me.  She denies any sore throat, fever, cough, nasal congestion, abdominal pain, nausea, vomiting, diarrhea.  Patient does have a long history of otitis over the years with multiple tubes in the past.  ROS: Negative except mentioned above. Vitals as per Epic.  GENERAL: NAD HEENT: no pharyngeal erythema, no exudate, L Ear: +tube, +scarring, +fluid, no significant erythema, no tenderness with movement of pinna, R Ear: +scarring, no significant erythema of TM, noticed with movement of pinna, no cervical LAD RESP: CTA B CARD: RRR NEURO: CN II-XII grossly intact   A/P: Right ear drainage -will refer patient to ENT given long history of ear issues and having taken 2 courses of antibiotics, Tylenol/ibuprofen as needed, antihistamine/decongestant as needed, avoid putting anything in the ear, seek immediate medical attention if symptoms worsen as discussed.
